# Patient Record
Sex: Male | Born: 1935 | Race: White | Hispanic: No | Marital: Married
Health system: Southern US, Community
[De-identification: ages and names within clinical notes are randomized; demographics above are authoritative.]

---

## 2009-11-22 ENCOUNTER — Inpatient Hospital Stay (HOSPITAL_COMMUNITY): Admission: EM | Admit: 2009-11-22 | Discharge: 2009-11-25 | Payer: Self-pay | Admitting: Emergency Medicine

## 2010-07-25 IMAGING — CR DG CHEST 1V PORT
1 series · 1 of 1 positions shown · non-contrast
Comparison: None

CLINICAL DATA: Syncope.  Weakness.

PORTABLE CHEST - 1 VIEW

[view not recorded]
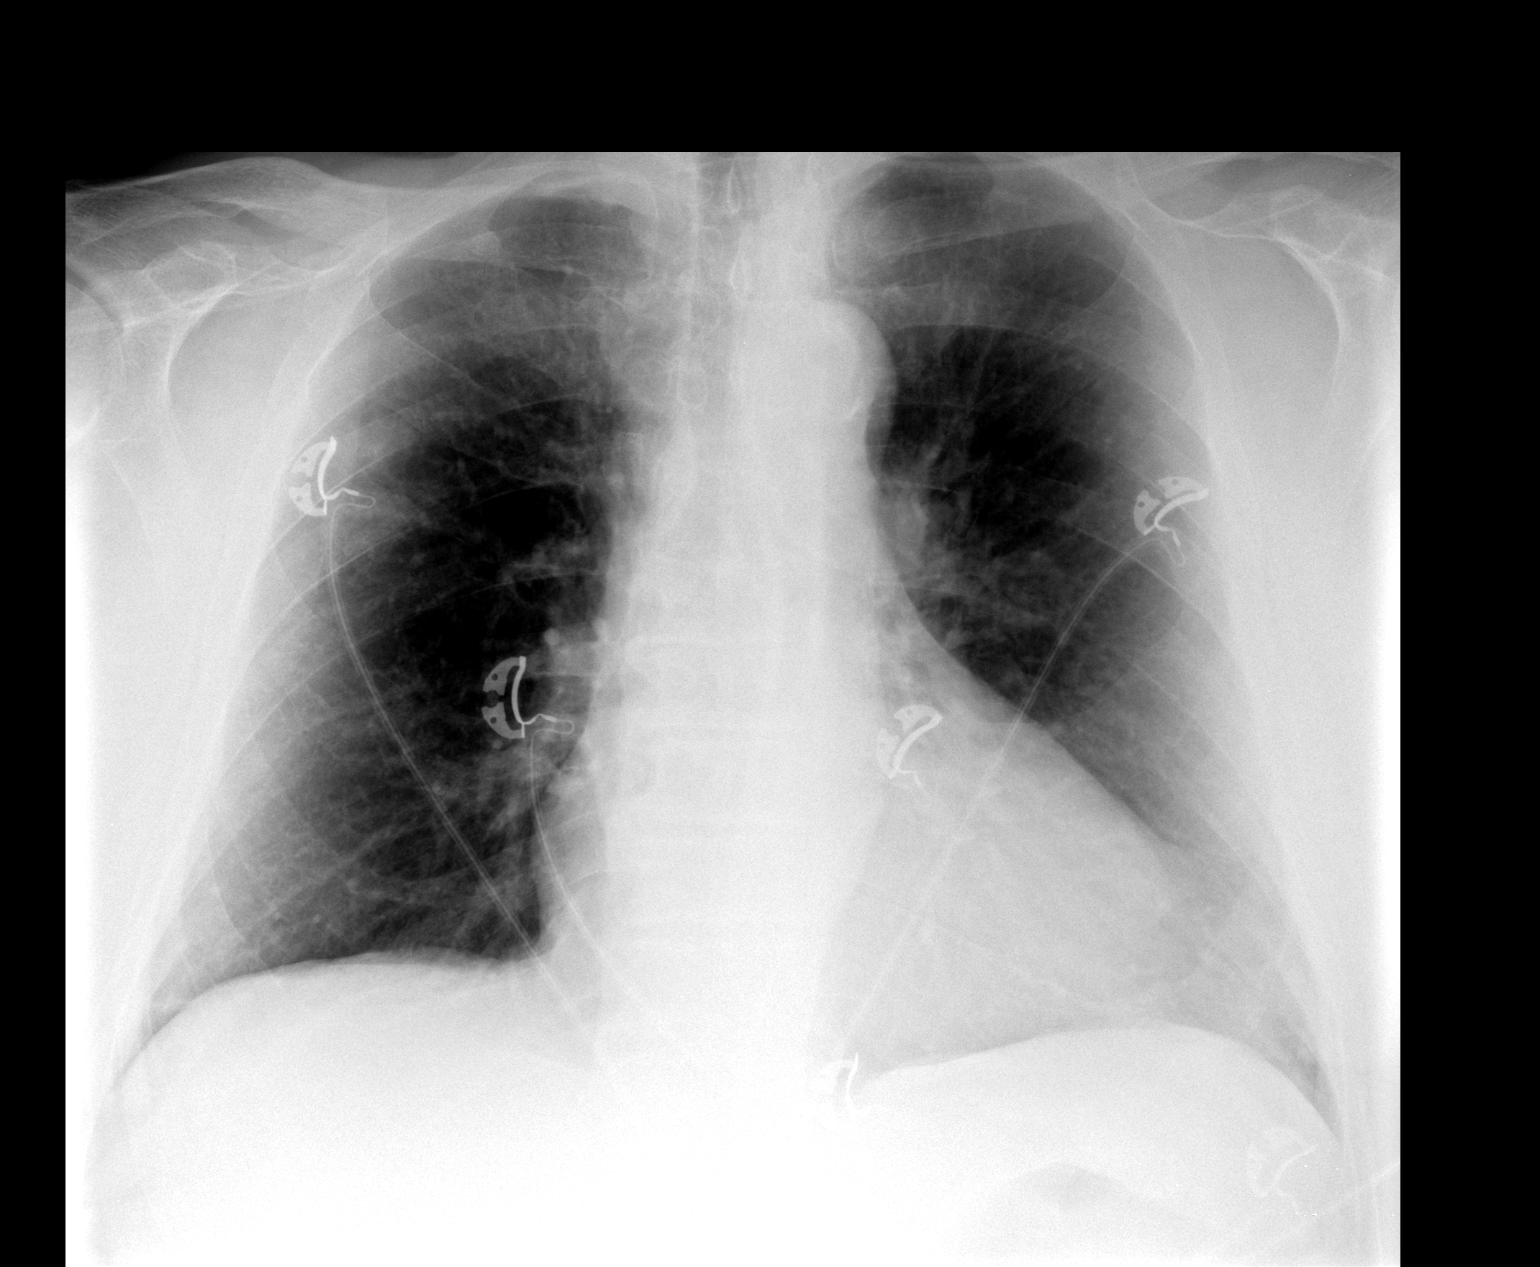

[1 of 1 positions shown; findings below may reference images not displayed]

FINDINGS: The heart size and pulmonary vascularity are normal and
the lungs are clear.  No significant bony abnormality.
IMPRESSION: No acute disease in the chest.

## 2011-03-01 LAB — BASIC METABOLIC PANEL
BUN: 6 mg/dL (ref 6–23)
CO2: 21 mEq/L (ref 19–32)
CO2: 25 mEq/L (ref 19–32)
Calcium: 8.6 mg/dL (ref 8.4–10.5)
Calcium: 8.8 mg/dL (ref 8.4–10.5)
Chloride: 109 mEq/L (ref 96–112)
Creatinine, Ser: 0.91 mg/dL (ref 0.4–1.5)
GFR calc Af Amer: >60 mL/min (ref >60)
GFR calc Af Amer: >60 mL/min (ref >60)
GFR calc non Af Amer: >60 mL/min (ref >60)
GFR calc non Af Amer: >60 mL/min (ref >60)
Glucose, Bld: 129 mg/dL — ABNORMAL HIGH (ref 70–99)
Glucose, Bld: 91 mg/dL (ref 70–99)
Potassium: 3.5 mEq/L (ref 3.5–5.1)
Potassium: 3.6 mEq/L (ref 3.5–5.1)
Sodium: 136 mEq/L (ref 135–145)
Sodium: 139 mEq/L (ref 135–145)

## 2011-03-01 LAB — CBC
HCT: 21.6 % — ABNORMAL LOW (ref 39.0–52.0)
HCT: 25.2 % — ABNORMAL LOW (ref 39.0–52.0)
HCT: 25.6 % — ABNORMAL LOW (ref 39.0–52.0)
HCT: 26.3 % — ABNORMAL LOW (ref 39.0–52.0)
HCT: 27.2 % — ABNORMAL LOW (ref 39.0–52.0)
Hemoglobin: 8.5 g/dL — ABNORMAL LOW (ref 13.0–17.0)
Hemoglobin: 8.8 g/dL — ABNORMAL LOW (ref 13.0–17.0)
Hemoglobin: 9.1 g/dL — ABNORMAL LOW (ref 13.0–17.0)
Hemoglobin: 9.9 g/dL — ABNORMAL LOW (ref 13.0–17.0)
MCHC: 33.8 g/dL (ref 30.0–36.0)
MCHC: 34 g/dL (ref 30.0–36.0)
MCHC: 34.1 g/dL (ref 30.0–36.0)
MCHC: 34.2 g/dL (ref 30.0–36.0)
MCHC: 34.2 g/dL (ref 30.0–36.0)
MCHC: 34.3 g/dL (ref 30.0–36.0)
MCHC: 34.8 g/dL (ref 30.0–36.0)
MCV: 91.2 fL (ref 78.0–100.0)
MCV: 91.7 fL (ref 78.0–100.0)
MCV: 91.7 fL (ref 78.0–100.0)
MCV: 92.1 fL (ref 78.0–100.0)
MCV: 92.5 fL (ref 78.0–100.0)
MCV: 93.4 fL (ref 78.0–100.0)
Platelets: 152 10*3/uL (ref 150–400)
Platelets: 157 10*3/uL (ref 150–400)
Platelets: 211 10*3/uL (ref 150–400)
RBC: 2.37 MIL/uL — ABNORMAL LOW (ref 4.22–5.81)
RBC: 2.69 MIL/uL — ABNORMAL LOW (ref 4.22–5.81)
RBC: 2.72 MIL/uL — ABNORMAL LOW (ref 4.22–5.81)
RBC: 2.73 MIL/uL — ABNORMAL LOW (ref 4.22–5.81)
RBC: 2.87 MIL/uL — ABNORMAL LOW (ref 4.22–5.81)
RBC: 3.07 MIL/uL — ABNORMAL LOW (ref 4.22–5.81)
RDW: 14.5 % (ref 11.5–15.5)
RDW: 14.7 % (ref 11.5–15.5)
RDW: 15 % (ref 11.5–15.5)
WBC: 11.4 10*3/uL — ABNORMAL HIGH (ref 4.0–10.5)
WBC: 12.2 10*3/uL — ABNORMAL HIGH (ref 4.0–10.5)
WBC: 9.4 10*3/uL (ref 4.0–10.5)
WBC: 9.7 10*3/uL (ref 4.0–10.5)

## 2011-03-01 LAB — COMPREHENSIVE METABOLIC PANEL
Albumin: 2.8 g/dL — ABNORMAL LOW (ref 3.5–5.2)
Alkaline Phosphatase: 41 U/L (ref 39–117)
BUN: 12 mg/dL (ref 6–23)
CO2: 23 mEq/L (ref 19–32)
Chloride: 110 mEq/L (ref 96–112)
Creatinine, Ser: 0.81 mg/dL (ref 0.4–1.5)
GFR calc non Af Amer: >60 mL/min (ref >60)
Potassium: 3.7 mEq/L (ref 3.5–5.1)
Total Bilirubin: 1 mg/dL (ref 0.3–1.2)

## 2011-03-01 LAB — URINALYSIS, ROUTINE W REFLEX MICROSCOPIC
Hgb urine dipstick: NEGATIVE
Ketones, ur: NEGATIVE mg/dL
Protein, ur: NEGATIVE mg/dL
Urobilinogen, UA: 0.2 mg/dL (ref 0.0–1.0)

## 2011-03-01 LAB — GLUCOSE, CAPILLARY: Glucose-Capillary: 104 mg/dL — ABNORMAL HIGH (ref 70–99)

## 2011-03-01 LAB — PROTIME-INR
INR: 1.17 (ref 0.00–1.49)
Prothrombin Time: 14.8 seconds (ref 11.6–15.2)

## 2011-03-01 LAB — CROSSMATCH: Antibody Screen: NEGATIVE

## 2011-03-01 LAB — DIFFERENTIAL
Basophils Relative: 0 % (ref 0–1)
Eosinophils Absolute: 0.1 10*3/uL (ref 0.0–0.7)
Lymphs Abs: 3.4 10*3/uL (ref 0.7–4.0)
Monocytes Absolute: 0.9 10*3/uL (ref 0.1–1.0)
Neutrophils Relative %: 65 % (ref 43–77)

## 2011-03-01 LAB — CK TOTAL AND CKMB (NOT AT ARMC)
CK, MB: 5.9 ng/mL — ABNORMAL HIGH (ref 0.3–4.0)
Total CK: 87 U/L (ref 7–232)

## 2011-03-01 LAB — URINE MICROSCOPIC-ADD ON

## 2011-03-01 LAB — ABO/RH: ABO/RH(D): A POS

## 2011-03-01 LAB — D-DIMER, QUANTITATIVE

## 2011-03-01 LAB — PREPARE RBC (CROSSMATCH)

## 2016-09-12 DIAGNOSIS — I4891 Unspecified atrial fibrillation: Secondary | ICD-10-CM

## 2016-09-12 DIAGNOSIS — J159 Unspecified bacterial pneumonia: Secondary | ICD-10-CM

## 2016-09-12 DIAGNOSIS — I5021 Acute systolic (congestive) heart failure: Secondary | ICD-10-CM

## 2016-09-15 DIAGNOSIS — J159 Unspecified bacterial pneumonia: Secondary | ICD-10-CM

## 2016-09-15 DIAGNOSIS — I4891 Unspecified atrial fibrillation: Secondary | ICD-10-CM

## 2016-09-15 DIAGNOSIS — I5021 Acute systolic (congestive) heart failure: Secondary | ICD-10-CM

## 2016-10-04 ENCOUNTER — Encounter: Payer: Self-pay | Admitting: Internal Medicine

## 2016-10-08 ENCOUNTER — Encounter: Payer: Self-pay | Admitting: Internal Medicine

## 2016-10-13 ENCOUNTER — Encounter: Payer: Self-pay | Admitting: Internal Medicine

## 2016-10-20 ENCOUNTER — Encounter: Payer: Self-pay | Admitting: Internal Medicine

## 2016-11-09 ENCOUNTER — Encounter: Payer: Self-pay | Admitting: Internal Medicine

## 2016-11-12 ENCOUNTER — Encounter: Payer: Self-pay | Admitting: Internal Medicine

## 2016-11-18 ENCOUNTER — Encounter: Payer: Self-pay | Admitting: Internal Medicine

## 2017-08-09 ENCOUNTER — Other Ambulatory Visit: Payer: Self-pay

## 2017-08-09 NOTE — Telephone Encounter (Signed)
Called pt to see if he would like to continue seeing Dr. Tomie Chinaevankar for his heart care so that his Torsemide could be refilled and pt stated that he doesn't have any insurance and so he would be seeing any one at the moment but thinks that he may have refills to last him through December. He will check with his pharmacy to make sure.

## 2017-09-12 ENCOUNTER — Telehealth: Payer: Self-pay

## 2017-09-12 ENCOUNTER — Other Ambulatory Visit: Payer: Self-pay

## 2017-09-12 ENCOUNTER — Other Ambulatory Visit: Payer: Self-pay | Admitting: Cardiology

## 2017-09-12 NOTE — Telephone Encounter (Signed)
°*  STAT* If patient is at the pharmacy, call can be transferred to refill team.   1. Which medications need to be refilled? (please list name of each medication and dose if known) Torsemide  2. Which pharmacy/location (including street and city if local pharmacy) is medication to be sent to [facility]  3. Do they need a 30 day or 90 day supply?30   *STAT* If patient is at the pharmacy, call can be transferred to refill team.   1. Which medications need to be refilled? (please list name of each medication and dose if known) Amiodorine   2. Which pharmacy/location (including street and city if local pharmacy) is medication to be sent to? [facility]  3. Do they need a 30 day or 90 day supply? 30

## 2017-09-12 NOTE — Telephone Encounter (Signed)
Informed patient of med request and that appointment would be needed to establish him as a patient of Dr. Geraldo Pitter. The patient did not wish to travel the distance and would rather remain a patient of the [place] office. Patient was informed to call the [place] office and request med refill from them. Craige Cotta

## 2017-09-12 NOTE — Telephone Encounter (Signed)
Instructed patient to call the office to set an appointment to establish as patient of Revankar in new office.

## 2018-03-09 ENCOUNTER — Other Ambulatory Visit: Payer: Self-pay

## 2019-05-30 DEATH — deceased
